# Patient Record
Sex: Female | Born: 1970 | Race: White | Marital: Married | Smoking: Current every day smoker
Health system: Southern US, Community
[De-identification: ages and names within clinical notes are randomized; demographics above are authoritative.]

## PROBLEM LIST (undated history)

## (undated) DIAGNOSIS — K219 Gastro-esophageal reflux disease without esophagitis: Secondary | ICD-10-CM

## (undated) DIAGNOSIS — Z9889 Other specified postprocedural states: Secondary | ICD-10-CM

## (undated) DIAGNOSIS — F32A Depression, unspecified: Secondary | ICD-10-CM

## (undated) HISTORY — PX: TUBAL LIGATION: SHX77

---

## 2005-05-17 ENCOUNTER — Ambulatory Visit: Payer: Self-pay | Admitting: Obstetrics and Gynecology

## 2009-12-08 ENCOUNTER — Ambulatory Visit: Payer: Self-pay | Admitting: Family Medicine

## 2011-12-06 ENCOUNTER — Ambulatory Visit: Payer: Self-pay | Admitting: Family Medicine

## 2011-12-14 ENCOUNTER — Ambulatory Visit: Payer: Self-pay | Admitting: Family Medicine

## 2012-09-18 ENCOUNTER — Ambulatory Visit: Payer: Self-pay | Admitting: Family Medicine

## 2012-10-01 ENCOUNTER — Ambulatory Visit: Payer: Self-pay | Admitting: Family Medicine

## 2013-09-29 ENCOUNTER — Ambulatory Visit: Payer: Self-pay

## 2013-09-29 DIAGNOSIS — R079 Chest pain, unspecified: Secondary | ICD-10-CM

## 2013-09-29 DIAGNOSIS — R52 Pain, unspecified: Secondary | ICD-10-CM

## 2013-09-29 LAB — CK TOTAL AND CKMB (NOT AT ARMC)
CK, Total: 72 U/L
CK-MB: 1.1 ng/mL (ref 0.5–3.6)

## 2013-09-29 LAB — COMPREHENSIVE METABOLIC PANEL
ALK PHOS: 78 U/L
AST: 11 U/L — AB (ref 15–37)
Albumin: 3.8 g/dL (ref 3.4–5.0)
Anion Gap: 9 (ref 7–16)
BUN: 7 mg/dL (ref 7–18)
Bilirubin,Total: 0.2 mg/dL (ref 0.2–1.0)
CREATININE: 0.65 mg/dL (ref 0.60–1.30)
Calcium, Total: 9.1 mg/dL (ref 8.5–10.1)
Chloride: 103 mmol/L (ref 98–107)
Co2: 28 mmol/L (ref 21–32)
EGFR (Non-African Amer.): >60
Glucose: 103 mg/dL — ABNORMAL HIGH (ref 65–99)
OSMOLALITY: 278 (ref 275–301)
Potassium: 3.3 mmol/L — ABNORMAL LOW (ref 3.5–5.1)
SGPT (ALT): 28 U/L (ref 12–78)
Sodium: 140 mmol/L (ref 136–145)
Total Protein: 7.2 g/dL (ref 6.4–8.2)

## 2013-09-29 LAB — CBC WITH DIFFERENTIAL/PLATELET
Basophil #: 0.1 10*3/uL (ref 0.0–0.1)
Basophil %: 0.4 %
EOS ABS: 0.1 10*3/uL (ref 0.0–0.7)
EOS PCT: 0.6 %
HCT: 41.9 % (ref 35.0–47.0)
HGB: 13.6 g/dL (ref 12.0–16.0)
Lymphocyte #: 1.3 10*3/uL (ref 1.0–3.6)
Lymphocyte %: 8.5 %
MCH: 30.3 pg (ref 26.0–34.0)
MCHC: 32.5 g/dL (ref 32.0–36.0)
MCV: 93 fL (ref 80–100)
MONOS PCT: 4.5 %
Monocyte #: 0.7 x10 3/mm (ref 0.2–0.9)
NEUTROS PCT: 86 %
Neutrophil #: 13 10*3/uL — ABNORMAL HIGH (ref 1.4–6.5)
Platelet: 361 10*3/uL (ref 150–440)
RBC: 4.49 10*6/uL (ref 3.80–5.20)
RDW: 13 % (ref 11.5–14.5)
WBC: 15.1 10*3/uL — ABNORMAL HIGH (ref 3.6–11.0)

## 2013-10-02 ENCOUNTER — Ambulatory Visit: Payer: Self-pay | Admitting: Family Medicine

## 2015-06-09 ENCOUNTER — Encounter: Payer: Self-pay | Admitting: Emergency Medicine

## 2015-06-09 ENCOUNTER — Ambulatory Visit (INDEPENDENT_AMBULATORY_CARE_PROVIDER_SITE_OTHER): Payer: BLUE CROSS/BLUE SHIELD

## 2015-06-09 ENCOUNTER — Ambulatory Visit
Admission: EM | Admit: 2015-06-09 | Discharge: 2015-06-09 | Disposition: A | Discharge status: Home / Self Care | Payer: BLUE CROSS/BLUE SHIELD | Attending: Family Medicine | Admitting: Family Medicine

## 2015-06-09 DIAGNOSIS — R109 Unspecified abdominal pain: Secondary | ICD-10-CM | POA: Insufficient documentation

## 2015-06-09 DIAGNOSIS — Z87440 Personal history of urinary (tract) infections: Secondary | ICD-10-CM | POA: Insufficient documentation

## 2015-06-09 LAB — URINALYSIS COMPLETE WITH MICROSCOPIC (ARMC ONLY)
Bilirubin Urine: NEGATIVE
GLUCOSE, UA: NEGATIVE mg/dL
Ketones, ur: NEGATIVE mg/dL
Leukocytes, UA: NEGATIVE
Nitrite: NEGATIVE
Protein, ur: NEGATIVE mg/dL
SPECIFIC GRAVITY, URINE: 1.01 (ref 1.005–1.030)
pH: 7 (ref 5.0–8.0)

## 2015-06-09 NOTE — ED Notes (Signed)
Patient c/o lower left sided back pain since Friday.  Patient denies N/V.  Patient denies fevers or chills.

## 2015-06-09 NOTE — ED Provider Notes (Signed)
Patient presents today with left flank discomfort since last week. Patient did get treated for UTI with Macrobid. Patient denies any dysuria, suprapubic tenderness, hematuria, fever, nausea, vomiting. Her symptoms have now started to radiate to the left lower anterior rib area. She feels a discomfort/itchiness in the area. She does not have any rash. She has been under quite a bit of stress over the last 2 weeks. She denies any shortness of breath, diaphoresis, headache. She does smoke and does admit to a history of GERD. She has noticed some coughing on occasion over the last 1-2 weeks.  ROS: Negative except mentioned above.  Vitals as per Epic GENERAL: NAD HEENT: no pharyngeal erythema, no exudate, no cervical LAD RESP: CTA B CARD: RRR ABD: +BS, NT, no guarding or rebound, mild left flank pain that radiates to left anterior rib cage, no rash in the area appreciated  NEURO: CN II-XII grossly intact   A/P: Left flank pain extending to left anterior rib cage- EKG was done which shows normal sinus rhythm with no ST elevation or depression or T-wave inversion, chest x-ray did not show any acute process, discussed with patient that if she develops any rash related to shingles given the distribution of her pain she should seek medical attention, the symptoms may be muscular in origin. Her urine does show 1+ blood but no white blood cells or bacteria. She does not have any urinary symptoms at this time. If her symptoms do persist or worsen I would do recommend further workup with imaging perhaps.  Jolene ProvostKirtida Kelsay Haggard, MD 06/09/15 1344

## 2015-06-09 NOTE — Discharge Instructions (Signed)
Seek medical attention if symptoms persist or worsen as discussed. If rash develops or other symptoms develop seek medical attention.

## 2015-06-11 LAB — URINE CULTURE
Culture: NO GROWTH
Special Requests: NORMAL

## 2015-06-11 NOTE — ED Notes (Signed)
Final report of urine C&S negative 

## 2018-05-28 ENCOUNTER — Encounter: Payer: Self-pay | Admitting: Adult Health

## 2019-01-06 ENCOUNTER — Other Ambulatory Visit: Payer: BLUE CROSS/BLUE SHIELD

## 2019-01-06 ENCOUNTER — Telehealth: Payer: Self-pay | Admitting: *Deleted

## 2019-01-06 DIAGNOSIS — Z20822 Contact with and (suspected) exposure to covid-19: Secondary | ICD-10-CM

## 2019-01-06 NOTE — Telephone Encounter (Signed)
Works for [employer]. Co-worker tested positive Covid19 3 days ago. Was told not to return to work until further notice. No fever nor any other symptoms. She and this person handled same items, frequently. She is required to have a negative test before returning to work. 14 days quarantine from exposure from the contact time. Wear mask around others if she leaves the home. Disinfect common areas and hand washing frequently. Monitor temperature. Call PCP if any sx arise.  Appointment made for [facility] this morning at 10:45a. Made aware to wear mask and stay in vehicle.

## 2019-01-07 LAB — NOVEL CORONAVIRUS, NAA: SARS-CoV-2, NAA: NOT DETECTED

## 2021-05-25 ENCOUNTER — Other Ambulatory Visit: Payer: Self-pay | Admitting: Family Medicine

## 2021-05-25 DIAGNOSIS — Z139 Encounter for screening, unspecified: Secondary | ICD-10-CM

## 2021-05-27 ENCOUNTER — Other Ambulatory Visit: Payer: Self-pay

## 2021-05-27 ENCOUNTER — Ambulatory Visit
Admission: RE | Admit: 2021-05-27 | Discharge: 2021-05-27 | Disposition: A | Discharge status: Home / Self Care | Payer: BC Managed Care – PPO | Source: Ambulatory Visit | Attending: Family Medicine | Admitting: Family Medicine

## 2021-05-27 DIAGNOSIS — Z139 Encounter for screening, unspecified: Secondary | ICD-10-CM

## 2021-06-07 ENCOUNTER — Other Ambulatory Visit: Payer: Self-pay | Admitting: Family Medicine

## 2021-06-07 DIAGNOSIS — R928 Other abnormal and inconclusive findings on diagnostic imaging of breast: Secondary | ICD-10-CM

## 2021-06-08 ENCOUNTER — Other Ambulatory Visit: Payer: Self-pay | Admitting: Family Medicine

## 2021-06-08 DIAGNOSIS — R921 Mammographic calcification found on diagnostic imaging of breast: Secondary | ICD-10-CM

## 2021-06-08 DIAGNOSIS — R928 Other abnormal and inconclusive findings on diagnostic imaging of breast: Secondary | ICD-10-CM

## 2021-06-17 ENCOUNTER — Ambulatory Visit
Admission: RE | Admit: 2021-06-17 | Discharge: 2021-06-17 | Disposition: A | Discharge status: Home / Self Care | Payer: BC Managed Care – PPO | Source: Ambulatory Visit | Attending: Family Medicine | Admitting: Family Medicine

## 2021-06-17 ENCOUNTER — Other Ambulatory Visit: Payer: Self-pay

## 2021-06-17 DIAGNOSIS — R928 Other abnormal and inconclusive findings on diagnostic imaging of breast: Secondary | ICD-10-CM | POA: Diagnosis present

## 2021-06-17 DIAGNOSIS — R921 Mammographic calcification found on diagnostic imaging of breast: Secondary | ICD-10-CM | POA: Diagnosis present

## 2021-06-20 ENCOUNTER — Other Ambulatory Visit: Payer: Self-pay | Admitting: Family Medicine

## 2021-06-20 DIAGNOSIS — R928 Other abnormal and inconclusive findings on diagnostic imaging of breast: Secondary | ICD-10-CM

## 2021-06-20 DIAGNOSIS — R921 Mammographic calcification found on diagnostic imaging of breast: Secondary | ICD-10-CM

## 2021-06-28 ENCOUNTER — Ambulatory Visit
Admission: RE | Admit: 2021-06-28 | Discharge: 2021-06-28 | Disposition: A | Discharge status: Home / Self Care | Payer: BC Managed Care – PPO | Source: Ambulatory Visit | Attending: Family Medicine | Admitting: Family Medicine

## 2021-06-28 ENCOUNTER — Other Ambulatory Visit: Payer: Self-pay

## 2021-06-28 DIAGNOSIS — R928 Other abnormal and inconclusive findings on diagnostic imaging of breast: Secondary | ICD-10-CM

## 2021-06-28 DIAGNOSIS — R921 Mammographic calcification found on diagnostic imaging of breast: Secondary | ICD-10-CM

## 2021-06-28 HISTORY — PX: BREAST BIOPSY: SHX20

## 2021-06-29 LAB — SURGICAL PATHOLOGY

## 2022-09-13 ENCOUNTER — Other Ambulatory Visit: Payer: Self-pay | Admitting: Family Medicine

## 2022-09-13 DIAGNOSIS — Z1231 Encounter for screening mammogram for malignant neoplasm of breast: Secondary | ICD-10-CM

## 2022-09-20 ENCOUNTER — Ambulatory Visit
Admission: RE | Admit: 2022-09-20 | Discharge: 2022-09-20 | Disposition: A | Discharge status: Home / Self Care | Payer: BC Managed Care – PPO | Source: Ambulatory Visit | Attending: Family Medicine | Admitting: Family Medicine

## 2022-09-20 DIAGNOSIS — Z1231 Encounter for screening mammogram for malignant neoplasm of breast: Secondary | ICD-10-CM | POA: Diagnosis not present

## 2022-12-19 ENCOUNTER — Encounter: Payer: Self-pay | Admitting: Podiatry

## 2022-12-19 ENCOUNTER — Ambulatory Visit: Payer: BC Managed Care – PPO | Admitting: Podiatry

## 2022-12-19 VITALS — BP 140/86 | HR 87

## 2022-12-19 DIAGNOSIS — B070 Plantar wart: Secondary | ICD-10-CM

## 2022-12-19 MED ORDER — TERBINAFINE HCL 250 MG PO TABS: 250.0000 mg | ORAL_TABLET | Freq: Every day | ORAL | 0 refills | Status: DC

## 2022-12-19 NOTE — Progress Notes (Signed)
Chief Complaint  Patient presents with   Ingrown Toenail    "I have ingrown toenails on my both my big toes and I have fungus." N - ingrown toenail L - hallux b/l D - over 10 years O - gradually worse C - throb, ache, tender, sore, fungus, discolored, thick A - bedsheets touching, shoes, pressure T - I cut them out a few weeks ago   Callouses    "I have calluses and Plantar warts." N - calluses/warts L - left D - calluses-31 yrs, warts - 6/8 mos O - gradually worse C - tender, sore A - shoes, pressure, walking, standing, hit a rock  T - shave them down    HPI: 52 y.o. female presenting today as a new patient for evaluation of symptomatic plantar wart that has been ongoing mostly to the left side of the body of the past 30+ years.  She gets warts on her hands leg and foot of the left side.  Onset after childbirth.  She has had the warts cut out in the past and frozen by dermatology.  Currently she shaves them down herself.  Patient also states that she has suffered from ingrown toenails to the medial and lateral border of the bilateral great toes over the last 10 years.  Every few weeks she tries to trim the nails out.  Presenting for further treatment and evaluation  No past medical history on file.  Past Surgical History:  Procedure Laterality Date   BREAST BIOPSY Right 06/28/2021   affir bx, coil marker, path pending   TUBAL LIGATION      Allergies  Allergen Reactions   Sulfa Antibiotics Anaphylaxis     Physical Exam: General: The patient is alert and oriented x3 in no acute distress.  Dermatology: Skin is warm, dry and supple bilateral lower extremities.  Hyperkeratotic skin lesions noted with a central nucleated core and pinpoint bleeding with debridement to the left foot.  There is also incurvated nail to the medial and lateral border of the bilateral great toes consistent with ingrowing toenail  Vascular: Palpable pedal pulses bilaterally. Capillary refill  within normal limits.  No appreciable edema.  No erythema.  Neurological: Grossly intact via light touch  Musculoskeletal Exam: No pedal deformities noted   Assessment/Plan of Care: 1.  Plantar verruca left foot; multiple 2.  Ingrowing toenails medial and lateral borders bilateral great toes  -Excisional debridement of the hyperkeratotic plantar wart lesions was performed today using a 312 scalpel.   -Salicylic acid was applied.  Recommend salicylic acid daily as tolerated over the next 4 weeks -Return to clinic in 4 weeks on a Friday afternoon.  At that time we will reassess the plantar warts and also proceed with partial nail matricectomy of the medial and lateral border of the bilateral great toes.  Patient works 5AM - 1PM Monday through Friday.  We will perform the nail matricectomy's on a Friday after work so she has the weekend to allow him to rest and heal.       Felecia Shelling, DPM [facility]  Dr. Felecia Shelling, DPM    [address], [place] [address]  Office [phone]  Fax [phone]

## 2023-01-19 ENCOUNTER — Ambulatory Visit: Payer: BC Managed Care – PPO | Admitting: Podiatry

## 2023-01-19 ENCOUNTER — Encounter: Payer: Self-pay | Admitting: Podiatry

## 2023-01-19 VITALS — BP 134/88 | HR 81

## 2023-01-19 DIAGNOSIS — L600 Ingrowing nail: Secondary | ICD-10-CM

## 2023-01-19 DIAGNOSIS — B070 Plantar wart: Secondary | ICD-10-CM

## 2023-01-19 NOTE — Progress Notes (Signed)
   Chief Complaint  Patient presents with   Plantar Warts    "It's very sore."   Nail Problem    "I think he's doing my ingrown toenails today, both big toes."    Subjective: Patient presents today for evaluation of pain to the medial and lateral border bilateral great toes. Patient is concerned for possible ingrown nail.  It is very sensitive to touch.    Patient also presents for follow-up evaluation of plantar verruca along the plantar aspect of the left foot.  She has been applying salicylic acid with improvement.  Patient presents today for further treatment and evaluation.  No past medical history on file.  Objective:  General: Well developed, nourished, in no acute distress, alert and oriented x3   Dermatology: Skin is warm, dry and supple bilateral.  Medial and lateral border bilateral great toes is tender with evidence of an ingrowing nail. Pain on palpation noted to the border of the nail fold. The remaining nails appear unremarkable at this time.  There continues to be Hyperkeratotic skin lesions noted with a central nucleated core and pinpoint bleeding with debridement to the left foot.   Vascular: DP and PT pulses palpable.  No clinical evidence of vascular compromise  Neruologic: Grossly intact via light touch bilateral.  Musculoskeletal: No pedal deformity noted  Assesement: #1 Paronychia with ingrowing nail medial and lateral border bilateral great toes #2 multiple plantar verruca left foot  Plan of Care:  1. Patient evaluated.  2. Discussed treatment alternatives and plan of care. Explained nail avulsion procedure and post procedure course to patient. 3. Patient opted for permanent partial nail avulsion of the ingrown portion of the nail.  4. Prior to procedure, local anesthesia infiltration utilized using 3 ml of a 50:50 mixture of 2% plain lidocaine and 0.5% plain marcaine in a normal hallux block fashion and a betadine prep performed.  5. Partial permanent  nail avulsion with chemical matrixectomy performed using 3x30sec applications of phenol followed by alcohol flush.  6. Light dressing applied.  Post care instructions provided 7.  Continue salicylic acid application as tolerated 8.  Return to clinic 3 weeks  Felecia Shelling, DPM [facility]  Dr. Felecia Shelling, DPM    [address] [place], [place] [address]                Office [phone]  Fax [phone]

## 2023-02-06 ENCOUNTER — Ambulatory Visit: Payer: BC Managed Care – PPO | Admitting: Podiatry

## 2023-02-06 DIAGNOSIS — L600 Ingrowing nail: Secondary | ICD-10-CM | POA: Diagnosis not present

## 2023-02-11 NOTE — Progress Notes (Signed)
   Chief Complaint  Patient presents with   Ingrown Toenail    Patient came in today for Bilateral  ingrown follow-up, and plantar wart follow-up     Subjective: 52 y.o. female presents today status post permanent nail avulsion procedure of the medial and lateral border bilateral great toes that was performed on 01/19/2023.  Patient doing well.  No new complaints.   No past medical history on file.  Objective: Neurovascular status intact.  Skin is warm, dry and supple. Nail and respective nail fold appears to be healing appropriately. There continues to be Hyperkeratotic skin lesions noted with a central nucleated core and pinpoint bleeding with debridement to the left foot.  Assessment: #1 s/p partial permanent nail matrixectomy medial and lateral border bilateral great toes #2 multiple plantar verruca left foot  Plan of care: -Patient was evaluated  -Light debridement of the periungual debris was performed to the border of the respective toe and nail plate using a tissue nipper. -Continue OTC topical salicylic acid as needed -Patient is to return to clinic on a PRN basis.   Felecia Shelling, DPM [facility]  Dr. Felecia Shelling, DPM    [address] [place], [place] [address]                Office [phone]  Fax [phone]

## 2023-05-01 IMAGING — MG MM BREAST BX W/ LOC DEV 1ST LESION IMAGE BX SPEC STEREO GUIDE*R*
6 of 9 series · 6 of 21 positions shown · non-contrast
Comparison: Previous exams.
COMPARISON: Previous exams.

Addendum:
CLINICAL DATA: Patient presents for stereotactic guided core biopsy
of RIGHT breast calcifications.

EXAM:
RIGHT BREAST STEREOTACTIC CORE NEEDLE BIOPSY

[R (1 of 3)]
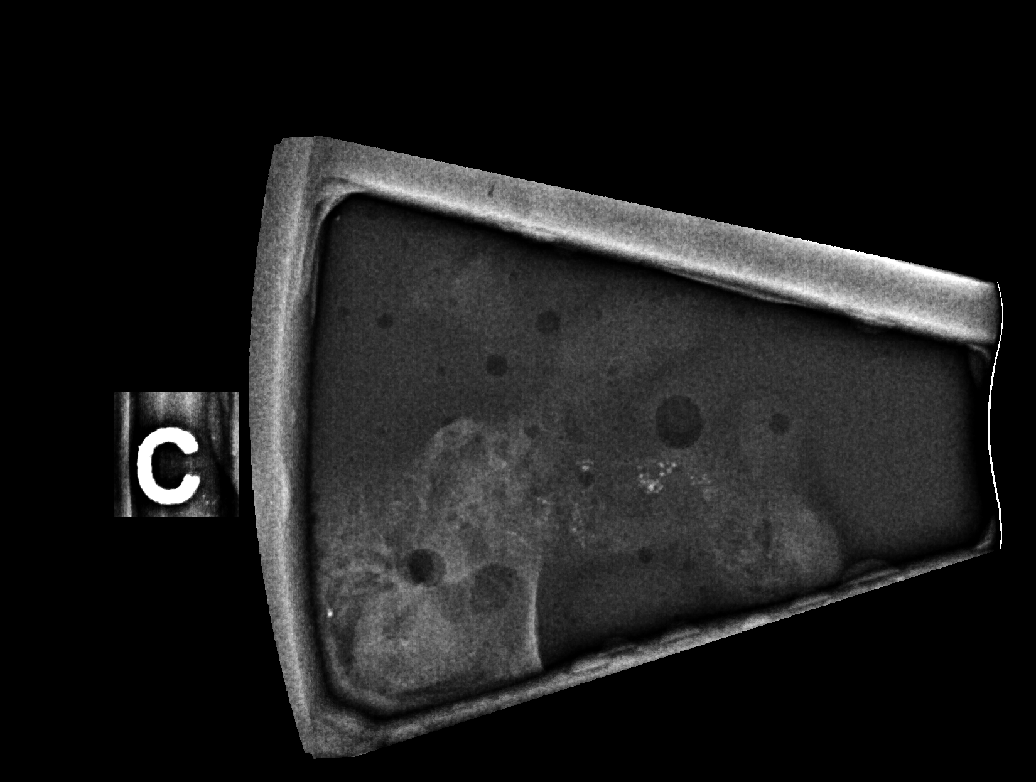

[R (2 of 3)]
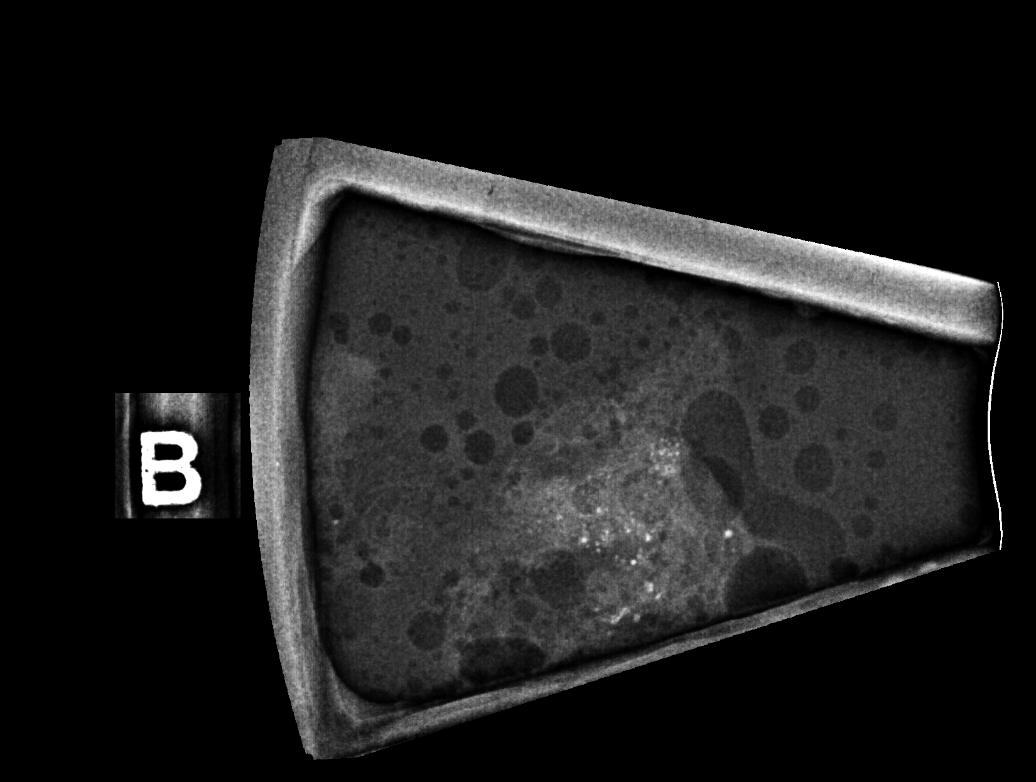

[R (3 of 3)]
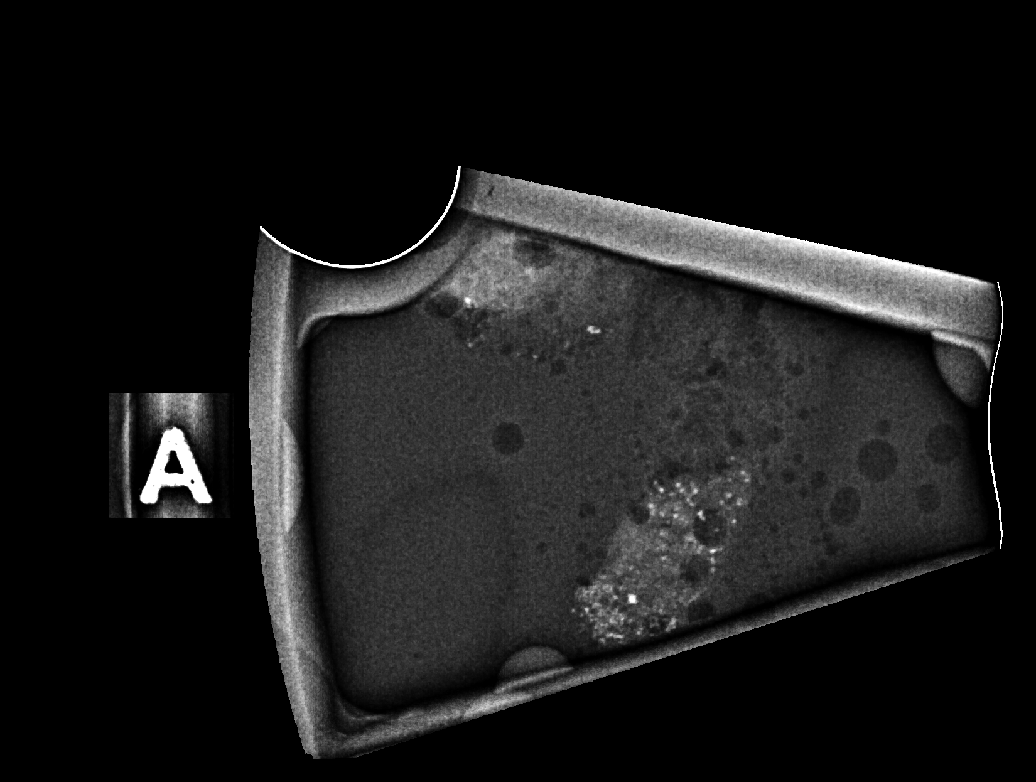

[R CC (1 of 3)]
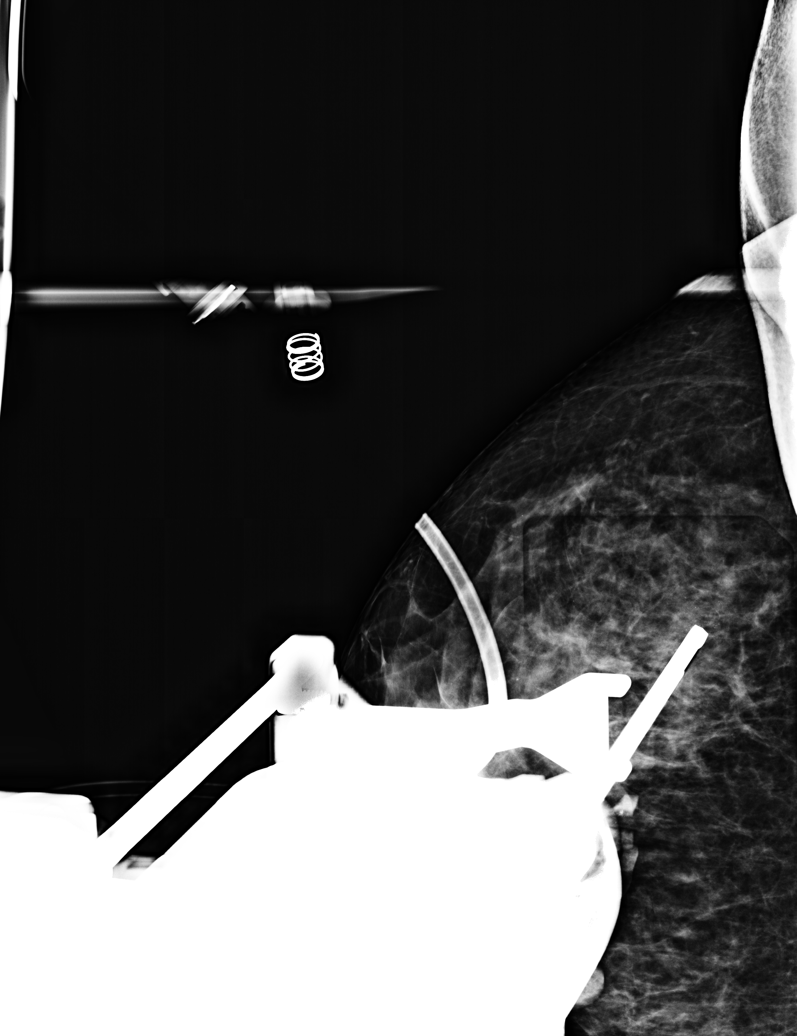

[R CC (2 of 3)]
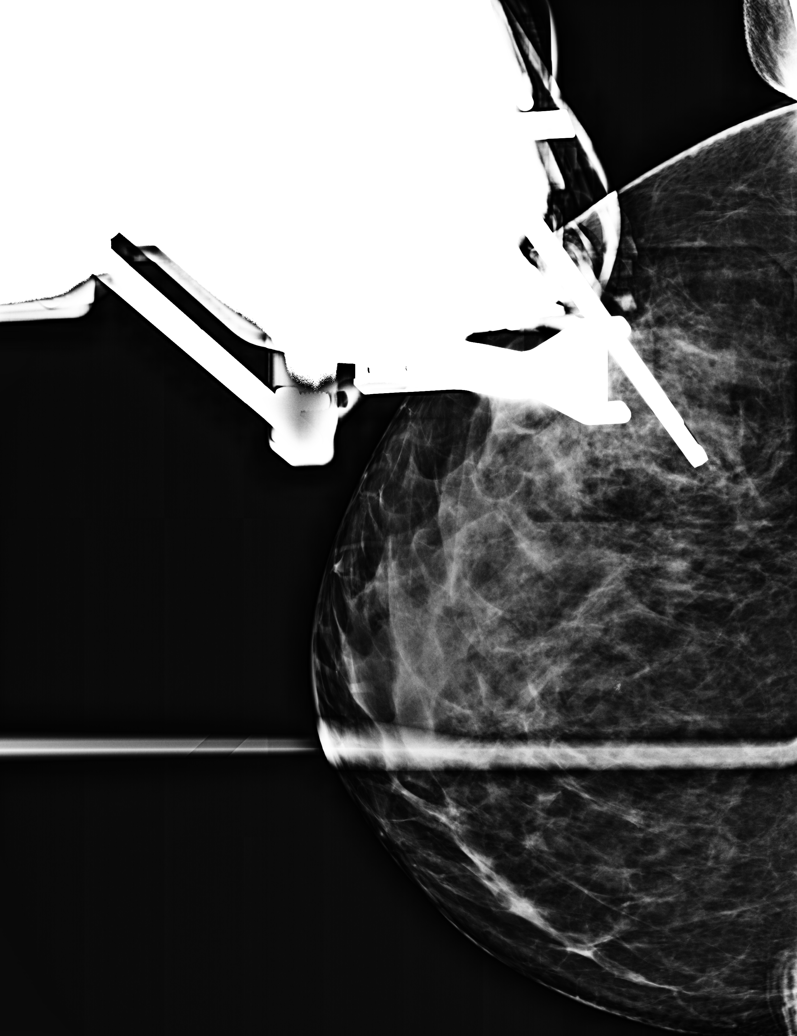

[R CC (3 of 3)]
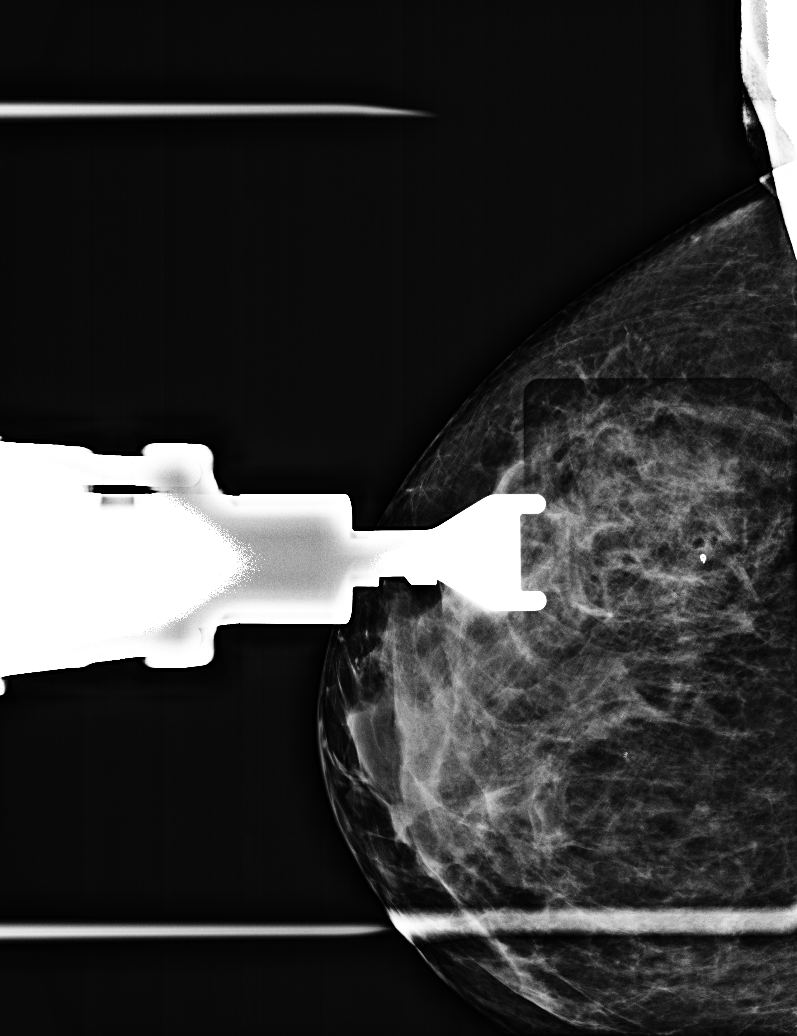

[6 of 21 positions shown; findings below may reference images not displayed]



Using sterile technique and 1% lidocaine and 1% lidocaine with
epinephrine as local anesthetic, under stereotactic guidance, a 9
gauge vacuum assisted device was used to perform core needle biopsy
of calcifications in the UPPER-OUTER QUADRANT of the RIGHT breast
using a craniocaudal approach. Specimen radiograph was performed
showing calcifications in numerous tissue samples. Specimens with
calcifications are identified for pathology.

Lesion quadrant: UPPER-OUTER QUADRANT RIGHT breast

At the conclusion of the procedure, coil shaped shaped tissue marker
clip was deployed into the biopsy cavity. Follow-up 2-view mammogram
was performed and dictated separately.
IMPRESSION: Stereotactic-guided biopsy of RIGHT breast calcifications. No
apparent complications.

ADDENDUM:
PATHOLOGY revealed: A. RIGHT BREAST, UPPER OUTER QUADRANT
CALCIFICATIONS; STEREOTACTIC BIOPSY: - FIBROCYSTIC AND COLUMNAR CELL
CHANGES WITH ASSOCIATED CALCIFICATIONS. - NEGATIVE FOR ATYPIA AND
MALIGNANCY. Comment: The report was amended to correct a
typographical error. The diagnosis remains unchanged.

Pathology results are CONCORDANT with imaging findings, per Dr.
Pweedi Obf.

Pathology results and recommendations were discussed with patient
via telephone on 06/29/2021. Patient reported biopsy site doing well
with no adverse symptoms, and only slight tenderness at the site.
Post biopsy care instructions were reviewed, questions were answered
and my direct phone number was provided. Patient was instructed to
call [HOSPITAL] for any additional questions or concerns
related to biopsy site.

RECOMMENDATION: Patient instructed to continue monthly self breast
examinations and resume annual bilateral screening mammogram due
April 2022.

Pathology results reported by Auntyjatty Delowr RN on 06/30/2021.



Using sterile technique and 1% lidocaine and 1% lidocaine with
epinephrine as local anesthetic, under stereotactic guidance, a 9
gauge vacuum assisted device was used to perform core needle biopsy
of calcifications in the UPPER-OUTER QUADRANT of the RIGHT breast
using a craniocaudal approach. Specimen radiograph was performed
showing calcifications in numerous tissue samples. Specimens with
calcifications are identified for pathology.

Lesion quadrant: UPPER-OUTER QUADRANT RIGHT breast

At the conclusion of the procedure, coil shaped shaped tissue marker
clip was deployed into the biopsy cavity. Follow-up 2-view mammogram
was performed and dictated separately.
IMPRESSION: Stereotactic-guided biopsy of RIGHT breast calcifications. No
apparent complications.

## 2023-05-08 ENCOUNTER — Ambulatory Visit: Payer: BC Managed Care – PPO | Admitting: Podiatry

## 2023-10-22 ENCOUNTER — Other Ambulatory Visit: Payer: Self-pay | Admitting: Family Medicine

## 2023-10-22 DIAGNOSIS — Z1231 Encounter for screening mammogram for malignant neoplasm of breast: Secondary | ICD-10-CM

## 2023-10-25 ENCOUNTER — Ambulatory Visit
Admission: RE | Admit: 2023-10-25 | Discharge: 2023-10-25 | Disposition: A | Discharge status: Home / Self Care | Source: Ambulatory Visit | Attending: Family Medicine | Admitting: Family Medicine

## 2023-10-25 DIAGNOSIS — Z1231 Encounter for screening mammogram for malignant neoplasm of breast: Secondary | ICD-10-CM | POA: Diagnosis present

## 2024-02-08 DIAGNOSIS — J189 Pneumonia, unspecified organism: Secondary | ICD-10-CM

## 2024-02-08 HISTORY — DX: Pneumonia, unspecified organism: J18.9

## 2024-02-13 ENCOUNTER — Other Ambulatory Visit: Payer: Self-pay | Admitting: Surgery

## 2024-02-15 ENCOUNTER — Ambulatory Visit (INDEPENDENT_AMBULATORY_CARE_PROVIDER_SITE_OTHER)

## 2024-02-15 ENCOUNTER — Ambulatory Visit: Admission: EM | Admit: 2024-02-15 | Discharge: 2024-02-15 | Disposition: A | Discharge status: Home / Self Care

## 2024-02-15 ENCOUNTER — Encounter: Payer: Self-pay | Admitting: Emergency Medicine

## 2024-02-15 DIAGNOSIS — S2232XA Fracture of one rib, left side, initial encounter for closed fracture: Secondary | ICD-10-CM

## 2024-02-15 DIAGNOSIS — R0781 Pleurodynia: Secondary | ICD-10-CM

## 2024-02-15 HISTORY — DX: Fracture of one rib, left side, initial encounter for closed fracture: S22.32XA

## 2024-02-15 MED ORDER — HYDROCODONE-ACETAMINOPHEN 5-325 MG PO TABS: 1.0000 | ORAL_TABLET | Freq: Four times a day (QID) | ORAL | 0 refills | Status: DC | PRN

## 2024-02-15 MED ORDER — KETOROLAC TROMETHAMINE 30 MG/ML IJ SOLN
30.0000 mg | Freq: Once | INTRAMUSCULAR | Status: AC
  Administered 2024-02-15: 30 mg via INTRAMUSCULAR

## 2024-02-15 NOTE — Discharge Instructions (Addendum)
 Your x-rays show that you did break your left 10th rib.  Unlike other fractures there is no cast that can be put in place to help stabilize the rib fracture.  However, you may purchase a rib belt from [employer] to help stabilize your ribs.  Wearing a shape wear a garment can also be effective in stabilizing the ribs.  I would recommend that you also Braisher rib with your arm or so whenever you cough or sneeze.  You do need to make sure that you are taking 10 deep breaths every hour to make sure that your lungs remain fully inflated and you do not develop a new pneumonia.  You may use over-the-counter Tylenol and/or ibuprofen according to the package instructions as needed for mild to moderate pain.  I have prescribed you Norco that you can use for more severe pain.  This medication contains hydrocodone which is also in your Hydromet.  Do not take both medications at the same time, you may take 1 or the other.  Also, the Norco contains Tylenol so make sure you are not taking more than 3000 mg of Tylenol in total in a 24-hour period.  If you develop any new or worsening symptoms other return for reevaluation or see your primary care provider.

## 2024-02-15 NOTE — ED Triage Notes (Signed)
 Pt c/o left sided rib pain, cough. She states she was diagnosed with bronchitis and pneumonia about 2 weeks ago. She has had the pain in her rib the entire time she was sick, but this morning she coughed hard this morning and heard a pop in this area. Pt is having trouble moving and taking a deep breath.

## 2024-02-15 NOTE — ED Provider Notes (Signed)
 [facility]    CSN: [id number] Arrival date & time: 02/15/24  0844      History   Chief Complaint Chief Complaint  Patient presents with   Cough   Pneumonia   left rib pain    HPI Julie Vang is a 53 y.o. female.   HPI  53 year old female with past medical here significant for GERD presents for evaluation of pain in her left lateral lower ribs after coughing hide this morning and hearing and feeling a pop.  She reports that the pain increases with movement but not with deep breathing.  She denies any shortness of breath.  She has not noticed any bruising, swelling, or redness.  History reviewed. No pertinent past medical history.  There are no active problems to display for this patient.   Past Surgical History:  Procedure Laterality Date   BREAST BIOPSY Right 06/28/2021   affir bx, coil marker, path pending   TUBAL LIGATION      OB History   No obstetric history on file.      Home Medications    Prior to Admission medications   Medication Sig Start Date End Date Taking? Authorizing Provider  amoxicillin-clavulanate (AUGMENTIN) 875-125 MG tablet SMARTSIG:1 Tablet(s) By Mouth Every 12 Hours 02/08/24  Yes [provider]  HYDROcodone-acetaminophen (NORCO/VICODIN) 5-325 MG tablet Take 1 tablet by mouth every 6 (six) hours as needed. 02/15/24  Yes Bernardino Ditch, NP  HYDROMET 5-1.5 MG/5ML syrup Take 5 mLs by mouth every 4 (four) hours as needed. 02/11/24  Yes [provider]  VENTOLIN HFA 108 (90 Base) MCG/ACT inhaler Inhale 2 puffs into the lungs every 4 (four) hours as needed. 02/04/24  Yes [provider]  esomeprazole (NEXIUM) 40 MG capsule Take 40 mg by mouth daily at 12 noon.    [provider]    Family History Family History  Problem Relation Age of Onset   Breast cancer Neg Hx     Social History Social History   Tobacco Use   Smoking status: Every Day    Current packs/day: 0.50    Types: Cigarettes    Smokeless tobacco: Never  Vaping Use   Vaping status: Never Used  Substance Use Topics   Alcohol use: No   Drug use: Never     Allergies   Sulfa antibiotics   Review of Systems Review of Systems  Respiratory:  Positive for cough. Negative for shortness of breath.   Cardiovascular:  Positive for chest pain.       Left lateral rib pain     Physical Exam Triage Vital Signs ED Triage Vitals [02/15/24 0855]  Encounter Vitals Group     BP      Girls Systolic BP Percentile      Girls Diastolic BP Percentile      Boys Systolic BP Percentile      Boys Diastolic BP Percentile      Pulse      Resp      Temp      Temp src      SpO2      Weight 145 lb 1 oz (65.8 kg)     Height 5' 4 (1.626 m)     Head Circumference      Peak Flow      Pain Score 9     Pain Loc      Pain Education      Exclude from Growth Chart    No data found.  Updated Vital Signs BP (!) 162/107 (BP Location: Right Arm)   Pulse 85   Temp 98 F (36.7 C) (Oral)   Resp 18   Ht 5' 4 (1.626 m)   Wt 145 lb 1 oz (65.8 kg)   SpO2 98%   BMI 24.90 kg/m   Visual Acuity Right Eye Distance:   Left Eye Distance:   Bilateral Distance:    Right Eye Near:   Left Eye Near:    Bilateral Near:     Physical Exam Vitals and nursing note reviewed.  Constitutional:      Appearance: Normal appearance. She is not ill-appearing.  HENT:     Head: Normocephalic and atraumatic.  Cardiovascular:     Rate and Rhythm: Normal rate and regular rhythm.     Pulses: Normal pulses.     Heart sounds: Normal heart sounds. No murmur heard.    No friction rub. No gallop.  Pulmonary:     Effort: Pulmonary effort is normal.     Breath sounds: Normal breath sounds. No wheezing, rhonchi or rales.     Comments: Patient is tender between the 9th and 10th rib in the midaxillary line on the left.  No appreciable crepitus.  No edema, ecchymosis, or erythema noted. Chest:     Chest wall: Tenderness present.  Skin:    General:  Skin is warm and dry.     Capillary Refill: Capillary refill takes less than 2 seconds.     Findings: No bruising or erythema.  Neurological:     General: No focal deficit present.     Mental Status: She is alert and oriented to person, place, and time.      UC Treatments / Results  Labs (all labs ordered are listed, but only abnormal results are displayed) Labs Reviewed - No data to display  EKG   Radiology DG Ribs Unilateral W/Chest Left Result Date: 02/15/2024 CLINICAL DATA:  53 year old female felt a pop when coughing. Pain over the 9th and 10th ribs. EXAM: LEFT RIBS AND CHEST - 3+ VIEW COMPARISON:  Chest radiographs 06/09/2015. FINDINGS: PA chest 0920 hours. Lung volumes and mediastinal contours remain normal. Visualized tracheal air column is within normal limits. No pneumothorax or pleural effusion. No consolidation. But patchy asymmetric peribronchial opacity in the right lower lung. Left lung appears negative. Negative visible bowel gas.  Normal background bone mineralization. Four views of the left ribs. Left lower ribs marked as the area of clinical concern, 10th rib level. No displaced left rib fracture. But suspicion of a nondisplaced left anterior 10th rib fracture on image #4 and #5. No other left rib fracture identified. Other visible osseous structures appear intact. Abdominal Calcified aortic atherosclerosis. IMPRESSION: 1. Nondisplaced left anterior 10th rib fracture. 2. Evidence of right lung bronchopneumonia. No consolidation or pleural effusion. 3.  Aortic Atherosclerosis (ICD10-I70.0). Electronically Signed   By: VEAR Hurst M.D.   On: 02/15/2024 09:41    Procedures Procedures (including critical care time)  Medications Ordered in UC Medications  ketorolac (TORADOL) 30 MG/ML injection 30 mg (30 mg Intramuscular Given 02/15/24 0933)    Initial Impression / Assessment and Plan / UC Course  I have reviewed the triage vital signs and the nursing notes.  Pertinent  labs & imaging results that were available during my care of the patient were reviewed by me and considered in my medical decision making (see chart for details).   Patient is a pleasant, nontoxic-appearing 53 year old female presenting for evaluation of left lateral  rib pain as outlined in HPI above.  In the exam room she is able to speak in full sentences no dyspnea or tachypnea.  Respiratory triage was 18 with a 98% room air oxygen saturation.  Her lungs are clear to auscultation in all fields.  She does have tenderness with palpation of the area around the 9th and 10th rib, mid axillary line on the left.  I do not appreciate crepitus and there is no edema, ecchymosis, or erythema.  I suspect that the patient has strained the muscle tissue between her ribs as a result of her coughing due to her bronchitis and pneumonia.  However, I will obtain a radiograph of the left ribs to rule out the presence of rib fracture or injury.  The patient has not take anything for pain this morning and I offered her oral or IM pain medication and she has selected IM.  I will order 30 mg of IM Toradol.  Patient's blood pressure is elevated at 162/107 and I suspect it is secondary to pain as patient has no documented history of hypertension.  I will have staff recheck a manual blood pressure 20 minutes after Toradol administration.  Left rib x-rays independently reviewed and evaluated by me.  Impression: Questionable incomplete fracture of the left ninth rib best visualized on the oblique view.  Radiology overread is pending. Radiology impression states nondisplaced left anterior 10th rib fracture.  I will discharge patient home with diagnosis of rib fracture and I will have her use over-the-counter Tylenol and/or ibuprofen as needed for mild to moderate pain.  I will give her a short prescription of Norco that she can use for more severe pain.  Review of PDMP shows that patient was recently prescribed Hydromet for her cough.   I will advised her not to take the medications together.   Final Clinical Impressions(s) / UC Diagnoses   Final diagnoses:  Rib pain on left side  Closed fracture of one rib of left side, initial encounter     Discharge Instructions      Your x-rays show that you did break your left 10th rib.  Unlike other fractures there is no cast that can be put in place to help stabilize the rib fracture.  However, you may purchase a rib belt from [employer] to help stabilize your ribs.  Wearing a shape wear a garment can also be effective in stabilizing the ribs.  I would recommend that you also Braisher rib with your arm or so whenever you cough or sneeze.  You do need to make sure that you are taking 10 deep breaths every hour to make sure that your lungs remain fully inflated and you do not develop a new pneumonia.  You may use over-the-counter Tylenol and/or ibuprofen according to the package instructions as needed for mild to moderate pain.  I have prescribed you Norco that you can use for more severe pain.  This medication contains hydrocodone which is also in your Hydromet.  Do not take both medications at the same time, you may take 1 or the other.  Also, the Norco contains Tylenol so make sure you are not taking more than 3000 mg of Tylenol in total in a 24-hour period.  If you develop any new or worsening symptoms other return for reevaluation or see your primary care provider.     ED Prescriptions     Medication Sig Dispense Auth. Provider   HYDROcodone-acetaminophen (NORCO/VICODIN) 5-325 MG tablet Take 1 tablet by mouth  every 6 (six) hours as needed. 10 tablet Bernardino Ditch, NP      I have reviewed the PDMP during this encounter.   Bernardino Ditch, NP 02/15/24 [phone]

## 2024-02-20 ENCOUNTER — Encounter
Admission: RE | Admit: 2024-02-20 | Discharge: 2024-02-20 | Disposition: A | Discharge status: Home / Self Care | Source: Ambulatory Visit | Attending: Surgery | Admitting: Surgery

## 2024-02-20 ENCOUNTER — Other Ambulatory Visit: Payer: Self-pay

## 2024-02-20 VITALS — Ht 64.0 in | Wt 164.0 lb

## 2024-02-20 DIAGNOSIS — Z01812 Encounter for preprocedural laboratory examination: Secondary | ICD-10-CM

## 2024-02-20 HISTORY — DX: Gastro-esophageal reflux disease without esophagitis: K21.9

## 2024-02-20 HISTORY — DX: Nausea with vomiting, unspecified: Z98.890

## 2024-02-20 HISTORY — DX: Depression, unspecified: F32.A

## 2024-02-20 NOTE — Pre-Procedure Instructions (Signed)
 Recent pneumonia and fractured rib from coughing. Stated that she currently has minimal coughing; hasn't used cough or pain medication for a week.

## 2024-02-20 NOTE — Patient Instructions (Addendum)
 Your procedure is scheduled on: Wednesday, July 30 Report to the Registration Desk on the 1st floor of the [facility] To find out your arrival time, please call [phone] between 1PM - 3PM on: Tuesday, July 29 If your arrival time is 6:00 am, do not arrive before that time as the [facility] entrance doors do not open until 6:00 am.  REMEMBER: Instructions that are not followed completely may result in serious medical risk, up to and including death; or upon the discretion of your surgeon and anesthesiologist your surgery may need to be rescheduled.  Do not eat food after midnight the night before surgery.  No gum chewing or hard candies.  You may however, drink CLEAR liquids up to 2 hours before you are scheduled to arrive for your surgery. Do not drink anything within 2 hours of your scheduled arrival time.  Clear liquids include: - water  - apple juice without pulp - gatorade (not RED colors) - black coffee or tea (Do NOT add milk or creamers to the coffee or tea) Do NOT drink anything that is not on this list.  In addition, your doctor has ordered for you to drink the provided:  Ensure Pre-Surgery Clear Carbohydrate Drink  Drinking this carbohydrate drink up to two hours before surgery helps to reduce insulin resistance and improve patient outcomes. Please complete drinking 2 hours before scheduled arrival time.  One week prior to surgery: starting today, July 23 Stop Anti-inflammatories (NSAIDS) such as Advil, Aleve, Ibuprofen, Motrin, Naproxen, Naprosyn and Aspirin based products such as Excedrin, Goody's Powder, BC Powder. Stop ANY OVER THE COUNTER supplements until after surgery.  You may however, continue to take Tylenol  if needed for pain up until the day of surgery.  Continue taking all of your other prescription medications up until the day of surgery.  ON THE DAY OF SURGERY ONLY TAKE THESE MEDICATIONS WITH SIPS OF WATER:  esomeprazole (NEXIUM)   No Alcohol  for 24 hours before or after surgery.  No Smoking including e-cigarettes for 24 hours before surgery.  No chewable tobacco products for at least 6 hours before surgery.  No nicotine patches on the day of surgery.  Do not use any recreational drugs for at least a week (preferably 2 weeks) before your surgery.  Please be advised that the combination of cocaine and anesthesia may have negative outcomes, up to and including death. If you test positive for cocaine, your surgery will be cancelled.  On the morning of surgery brush your teeth with toothpaste and water, you may rinse your mouth with mouthwash if you wish. Do not swallow any toothpaste or mouthwash.  Use CHG Soap as directed on instruction sheet.  Do not wear jewelry, make-up, hairpins, clips or nail polish.  For welded (permanent) jewelry: bracelets, anklets, waist bands, etc.  Please have this removed prior to surgery.  If it is not removed, there is a chance that hospital personnel will need to cut it off on the day of surgery.  Do not wear lotions, powders, or perfumes.   Do not shave body hair from the neck down 48 hours before surgery.  Contact lenses, hearing aids and dentures may not be worn into surgery.  Do not bring valuables to the hospital. [facility] is not responsible for any missing/lost belongings or valuables.   Notify your doctor if there is any change in your medical condition (cold, fever, infection).  Wear comfortable clothing (specific to your surgery type) to the hospital.  After surgery, you can help prevent lung complications by doing breathing exercises.  Take deep breaths and cough every 1-2 hours. Your doctor may order a device called an Incentive Spirometer to help you take deep breaths.  If you are being discharged the day of surgery, you will not be allowed to drive home. You will need a responsible individual to drive you home and stay with you for 24 hours after surgery.   If you are  taking public transportation, you will need to have a responsible individual with you.  Please call the Pre-admissions Testing Dept. at [phone] if you have any questions about these instructions.  Surgery Visitation Policy:  Patients having surgery or a procedure may have two visitors.  Children under the age of 44 must have an adult with them who is not the patient.  Merchandiser, retail to address health-related social needs:  [email or web address]      Preparing for Surgery with CHLORHEXIDINE GLUCONATE (CHG) Soap  Chlorhexidine Gluconate (CHG) Soap  o An antiseptic cleaner that kills germs and bonds with the skin to continue killing germs even after washing  o Used for showering the night before surgery and morning of surgery  Before surgery, you can play an important role by reducing the number of germs on your skin.  CHG (Chlorhexidine gluconate) soap is an antiseptic cleanser which kills germs and bonds with the skin to continue killing germs even after washing.  Please do not use if you have an allergy to CHG or antibacterial soaps. If your skin becomes reddened/irritated stop using the CHG.  1. Shower the NIGHT BEFORE SURGERY and the MORNING OF SURGERY with CHG soap.  2. If you choose to wash your hair, wash your hair first as usual with your normal shampoo.  3. After shampooing, rinse your hair and body thoroughly to remove the shampoo.  4. Use CHG as you would any other liquid soap. You can apply CHG directly to the skin and wash gently with a scrungie or a clean washcloth.  5. Apply the CHG soap to your body only from the neck down. Do not use on open wounds or open sores. Avoid contact with your eyes, ears, mouth, and genitals (private parts). Wash face and genitals (private parts) with your normal soap.  6. Wash thoroughly, paying special attention to the area where your surgery will be performed.  7. Thoroughly rinse your body with warm  water.  8. Do not shower/wash with your normal soap after using and rinsing off the CHG soap.  9. Pat yourself dry with a clean towel.  10. Wear clean pajamas to bed the night before surgery.  12. Place clean sheets on your bed the night of your first shower and do not sleep with pets.  13. Shower again with the CHG soap on the day of surgery prior to arriving at the hospital.  14. Do not apply any deodorants/lotions/powders.  15. Please wear clean clothes to the hospital.

## 2024-02-26 MED ORDER — CHLORHEXIDINE GLUCONATE 0.12 % MT SOLN
15.0000 mL | Freq: Once | OROMUCOSAL | Status: AC
  Administered 2024-02-27: 15 mL via OROMUCOSAL

## 2024-02-26 MED ORDER — LACTATED RINGERS IV SOLN: INTRAVENOUS | Status: DC

## 2024-02-26 MED ORDER — ORAL CARE MOUTH RINSE: 15.0000 mL | Freq: Once | OROMUCOSAL | Status: AC

## 2024-02-26 MED ORDER — CEFAZOLIN SODIUM-DEXTROSE 2-4 GM/100ML-% IV SOLN
2.0000 g | INTRAVENOUS | Status: AC
  Administered 2024-02-27: 2 g via INTRAVENOUS

## 2024-02-26 NOTE — H&P (Incomplete Revision)
 History of Present Illness:  Julie Vang is a 53 y.o. female who presents for evaluation and treatment of her bilateral basilar thumb pain, right equal to left. The patient notes that the symptoms have been present for several years and developed without any specific cause or injury. 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 Her symptoms are worse with any grasping or holding of objects. She has difficulty grasping or turning a doorknob. She denies any numbness or paresthesias to either hand. She has tried over-the-counter medications, Mobic, and activity modification in the past with limited benefit.  Current Outpatient Medications:  esomeprazole (NEXIUM) 40 MG DR capsule Take by mouth  meclizine (ANTIVERT) 25 mg tablet TAKE 1 TABLET BY MOUTH FOUR TIMES DAILY AS NEEDED FOR DIZZINESS  meloxicam (MOBIC) 7.5 MG tablet Take 7.5 mg by mouth once daily  sertraline (ZOLOFT) 50 MG tablet Take 50 mg by mouth once daily  nitrofurantoin (MACRODANTIN) 100 MG capsule Take 100 mg by mouth 2 (two) times daily (Patient not taking: Reported on 01/09/2022)   Allergies:  Sulfa (Sulfonamide Antibiotics) Rash   Past Medical History:  Acid reflux  History of abnormal cervical Pap smear   Past Surgical History:   TUBAL LIGATION 2005   Family History:  Diabetes Mother  Arthritis Mother  Systemic lupus erythematosis Mother  Heart disease Father  Heart disease Maternal Grandfather  Heart disease Paternal Grandfather   Social History:   Socioeconomic History:  Marital status: Married  Tobacco Use  Smoking status: Every Day  Types: Cigarettes  Smokeless tobacco: Never  Substance and Sexual Activity  Alcohol use: Not Currently  Drug use: Never  Sexual activity: Yes  Partners: Male  Birth control/protection: None   Review of Systems:  A comprehensive 14 point ROS  was performed, reviewed, and the pertinent orthopaedic findings are documented in the HPI.  Physical Exam: Vitals:  01/14/24 1321  BP: (!) 140/82  Weight: 77.1 kg (170 lb)  Height: 162.6 cm (5' 4)  PainSc: 7  PainLoc: Finger   General/Constitutional: The patient appears to be well-nourished, well-developed, and in no acute distress. Neuro/Psych: Normal mood and affect, oriented to person, place and time. Eyes: Non-icteric. Pupils are equal, round, and reactive to light, and exhibit synchronous movement. ENT: Unremarkable. Lymphatic: No palpable adenopathy. Respiratory: Lungs clear to auscultation, Normal chest excursion, No wheezes, and Non-labored breathing Cardiovascular: Regular rate and rhythm. No murmurs. and No edema, swelling or tenderness, except as noted in detailed exam. Integumentary: No impressive skin lesions present, except as noted in detailed exam. Musculoskeletal: Unremarkable, except as noted in detailed exam.  Right wrist/hand exam: Skin inspection of the right wrist and hand is notable for swelling and deformity/prominence of the base of the right thumb, but otherwise is unremarkable. No erythema, ecchymosis, abrasions, or other skin abnormalities are identified. She has moderate tenderness to palpation over the base of her right thumb. There is moderate crepitance and pain with shucking of the thumb CMC joint. She is able to actively flex and extend her wrist without any pain or catching. She can flex and extend all digits fully without any pain or triggering. She is grossly neurovascularly intact to all digits.  X-rays/MRI/Lab data:  AP, lateral, and oblique views of the right thumb are obtained. These films demonstrate moderate-severe degenerative changes of the right thumb CMC joint as manifest by joint  subluxation, joint space narrowing, sclerosis, and subchondral cystic changes.  Assessment: 1. 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  Plan: The  treatment options were discussed with the patient and her aunt. In addition, patient educational materials were provided regarding the diagnosis and treatment options. The patient is quite frustrated by her symptoms and functional limitations, and is ready to consider more aggressive treatment options. Therefore, I have recommended a surgical procedure, specifically a suspension arthroplasty of the right thumb CMC joint, as this is her dominant hand. The procedure was discussed with the patient, as were the potential risks (including bleeding, infection, nerve and/or blood vessel injury, persistent or recurrent pain, failure of the reconstructive procedure, stiffness of the thumb, weakness of grip, need for further surgery, blood clots, strokes, heart attacks and/or arhythmias, pneumonia, etc.) and benefits. The patient states her understanding and wishes to proceed. All of the patient's questions and concerns were answered. She can call any time with further concerns. She will follow up post-surgery, routine.    H&P reviewed and patient re-examined. No changes.

## 2024-02-26 NOTE — H&P (Signed)
 History of Present Illness:  Julie Vang is a 53 y.o. female who presents for evaluation and treatment of her bilateral basilar thumb pain, right equal to left. The patient notes that the symptoms have been present for several years and developed without any specific cause or injury. 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 Her symptoms are worse with any grasping or holding of objects. She has difficulty grasping or turning a doorknob. She denies any numbness or paresthesias to either hand. She has tried over-the-counter medications, Mobic, and activity modification in the past with limited benefit.  Current Outpatient Medications:  esomeprazole (NEXIUM) 40 MG DR capsule Take by mouth  meclizine (ANTIVERT) 25 mg tablet TAKE 1 TABLET BY MOUTH FOUR TIMES DAILY AS NEEDED FOR DIZZINESS  meloxicam (MOBIC) 7.5 MG tablet Take 7.5 mg by mouth once daily  sertraline (ZOLOFT) 50 MG tablet Take 50 mg by mouth once daily  nitrofurantoin (MACRODANTIN) 100 MG capsule Take 100 mg by mouth 2 (two) times daily (Patient not taking: Reported on 01/09/2022)   Allergies:  Sulfa (Sulfonamide Antibiotics) Rash   Past Medical History:  Acid reflux  History of abnormal cervical Pap smear   Past Surgical History:   TUBAL LIGATION 2005   Family History:  Diabetes Mother  Arthritis Mother  Systemic lupus erythematosis Mother  Heart disease Father  Heart disease Maternal Grandfather  Heart disease Paternal Grandfather   Social History:   Socioeconomic History:  Marital status: Married  Tobacco Use  Smoking status: Every Day  Types: Cigarettes  Smokeless tobacco: Never  Substance and Sexual Activity  Alcohol use: Not Currently  Drug use: Never  Sexual activity: Yes  Partners: Male  Birth control/protection: None   Review of Systems:  A comprehensive 14 point ROS  was performed, reviewed, and the pertinent orthopaedic findings are documented in the HPI.  Physical Exam: Vitals:  01/14/24 1321  BP: (!) 140/82  Weight: 77.1 kg (170 lb)  Height: 162.6 cm (5' 4)  PainSc: 7  PainLoc: Finger   General/Constitutional: The patient appears to be well-nourished, well-developed, and in no acute distress. Neuro/Psych: Normal mood and affect, oriented to person, place and time. Eyes: Non-icteric. Pupils are equal, round, and reactive to light, and exhibit synchronous movement. ENT: Unremarkable. Lymphatic: No palpable adenopathy. Respiratory: Lungs clear to auscultation, Normal chest excursion, No wheezes, and Non-labored breathing Cardiovascular: Regular rate and rhythm. No murmurs. and No edema, swelling or tenderness, except as noted in detailed exam. Integumentary: No impressive skin lesions present, except as noted in detailed exam. Musculoskeletal: Unremarkable, except as noted in detailed exam.  Right wrist/hand exam: Skin inspection of the right wrist and hand is notable for swelling and deformity/prominence of the base of the right thumb, but otherwise is unremarkable. No erythema, ecchymosis, abrasions, or other skin abnormalities are identified. She has moderate tenderness to palpation over the base of her right thumb. There is moderate crepitance and pain with shucking of the thumb CMC joint. She is able to actively flex and extend her wrist without any pain or catching. She can flex and extend all digits fully without any pain or triggering. She is grossly neurovascularly intact to all digits.  X-rays/MRI/Lab data:  AP, lateral, and oblique views of the right thumb are obtained. These films demonstrate moderate-severe degenerative changes of the right thumb CMC joint as manifest by joint  subluxation, joint space narrowing, sclerosis, and subchondral cystic changes.  Assessment: 1. 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  Plan: The  treatment options were discussed with the patient and her aunt. In addition, patient educational materials were provided regarding the diagnosis and treatment options. The patient is quite frustrated by her symptoms and functional limitations, and is ready to consider more aggressive treatment options. Therefore, I have recommended a surgical procedure, specifically a suspension arthroplasty of the right thumb CMC joint, as this is her dominant hand. The procedure was discussed with the patient, as were the potential risks (including bleeding, infection, nerve and/or blood vessel injury, persistent or recurrent pain, failure of the reconstructive procedure, stiffness of the thumb, weakness of grip, need for further surgery, blood clots, strokes, heart attacks and/or arhythmias, pneumonia, etc.) and benefits. The patient states her understanding and wishes to proceed. All of the patient's questions and concerns were answered. She can call any time with further concerns. She will follow up post-surgery, routine.    H&P reviewed and patient re-examined. No changes.

## 2024-02-27 ENCOUNTER — Ambulatory Visit

## 2024-02-27 ENCOUNTER — Other Ambulatory Visit: Payer: Self-pay

## 2024-02-27 ENCOUNTER — Encounter
Admission: RE | Disposition: A | Discharge status: Home / Self Care | Payer: Self-pay | Source: Home / Self Care | Attending: Surgery

## 2024-02-27 ENCOUNTER — Ambulatory Visit
Admission: RE | Admit: 2024-02-27 | Discharge: 2024-02-27 | Disposition: A | Discharge status: Home / Self Care | Attending: Surgery | Admitting: Surgery

## 2024-02-27 ENCOUNTER — Ambulatory Visit: Admitting: Anesthesiology

## 2024-02-27 ENCOUNTER — Encounter: Payer: Self-pay | Admitting: Surgery

## 2024-02-27 DIAGNOSIS — M189 Osteoarthritis of first carpometacarpal joint, unspecified: Secondary | ICD-10-CM | POA: Diagnosis present

## 2024-02-27 DIAGNOSIS — F32A Depression, unspecified: Secondary | ICD-10-CM | POA: Diagnosis not present

## 2024-02-27 DIAGNOSIS — K219 Gastro-esophageal reflux disease without esophagitis: Secondary | ICD-10-CM | POA: Insufficient documentation

## 2024-02-27 DIAGNOSIS — Z01812 Encounter for preprocedural laboratory examination: Secondary | ICD-10-CM

## 2024-02-27 DIAGNOSIS — F17210 Nicotine dependence, cigarettes, uncomplicated: Secondary | ICD-10-CM | POA: Diagnosis not present

## 2024-02-27 HISTORY — PX: CARPOMETACARPAL (CMC) FUSION OF THUMB: SHX6290

## 2024-02-27 SURGERY — CARPOMETACARPAL (CMC) FUSION OF THUMB
Anesthesia: General | Site: Thumb | Laterality: Right

## 2024-02-27 MED ORDER — OXYCODONE HCL 5 MG PO TABS
ORAL_TABLET | ORAL | Status: AC
Start: 2024-02-27 — End: 2024-02-27
  Filled 2024-02-27: qty 1

## 2024-02-27 MED ORDER — BUPIVACAINE HCL (PF) 0.5 % IJ SOLN
INTRAMUSCULAR | Status: DC | PRN
Start: 2024-02-27 — End: 2024-02-27
  Administered 2024-02-27: 10 mL

## 2024-02-27 MED ORDER — SODIUM CHLORIDE 0.9 % IV SOLN: INTRAVENOUS | Status: DC

## 2024-02-27 MED ORDER — MIDAZOLAM HCL 2 MG/2ML IJ SOLN
INTRAMUSCULAR | Status: DC | PRN
Start: 2024-02-27 — End: 2024-02-27
  Administered 2024-02-27: 2 mg via INTRAVENOUS

## 2024-02-27 MED ORDER — ONDANSETRON 4 MG PO TBDP
4.0000 mg | ORAL_TABLET | Freq: Three times a day (TID) | ORAL | 1 refills | Status: AC | PRN
Start: 2024-02-27 — End: ?

## 2024-02-27 MED ORDER — DROPERIDOL 2.5 MG/ML IJ SOLN
INTRAMUSCULAR | Status: AC
  Filled 2024-02-27: qty 2

## 2024-02-27 MED ORDER — SCOPOLAMINE 1 MG/3DAYS TD PT72
MEDICATED_PATCH | TRANSDERMAL | Status: AC
  Filled 2024-02-27: qty 1

## 2024-02-27 MED ORDER — ALBUTEROL SULFATE HFA 108 (90 BASE) MCG/ACT IN AERS
INHALATION_SPRAY | RESPIRATORY_TRACT | Status: DC | PRN
  Administered 2024-02-27: 4 via RESPIRATORY_TRACT

## 2024-02-27 MED ORDER — OXYCODONE HCL 5 MG PO TABS
5.0000 mg | ORAL_TABLET | Freq: Once | ORAL | Status: AC | PRN
  Administered 2024-02-27: 5 mg via ORAL

## 2024-02-27 MED ORDER — OXYCODONE HCL 5 MG/5ML PO SOLN: 5.0000 mg | Freq: Once | ORAL | Status: AC | PRN

## 2024-02-27 MED ORDER — ONDANSETRON HCL 4 MG/2ML IJ SOLN
4.0000 mg | Freq: Once | INTRAMUSCULAR | Status: AC | PRN
  Administered 2024-02-27: 4 mg via INTRAVENOUS

## 2024-02-27 MED ORDER — ACETAMINOPHEN 10 MG/ML IV SOLN
INTRAVENOUS | Status: DC | PRN
  Administered 2024-02-27: 1000 mg via INTRAVENOUS

## 2024-02-27 MED ORDER — 0.9 % SODIUM CHLORIDE (POUR BTL) OPTIME
TOPICAL | Status: DC | PRN
  Administered 2024-02-27: 150 mL

## 2024-02-27 MED ORDER — FENTANYL CITRATE (PF) 100 MCG/2ML IJ SOLN
25.0000 ug | INTRAMUSCULAR | Status: DC | PRN
  Administered 2024-02-27: 25 ug via INTRAVENOUS
  Administered 2024-02-27: 50 ug via INTRAVENOUS
  Administered 2024-02-27: 25 ug via INTRAVENOUS

## 2024-02-27 MED ORDER — DEXAMETHASONE SODIUM PHOSPHATE 10 MG/ML IJ SOLN
INTRAMUSCULAR | Status: DC | PRN
  Administered 2024-02-27: 5 mg via INTRAVENOUS

## 2024-02-27 MED ORDER — HYDROCODONE-ACETAMINOPHEN 5-325 MG PO TABS: 1.0000 | ORAL_TABLET | Freq: Four times a day (QID) | ORAL | 0 refills | Status: AC | PRN

## 2024-02-27 MED ORDER — ONDANSETRON HCL 4 MG/2ML IJ SOLN
INTRAMUSCULAR | Status: AC
  Filled 2024-02-27: qty 2

## 2024-02-27 MED ORDER — FENTANYL CITRATE (PF) 100 MCG/2ML IJ SOLN
INTRAMUSCULAR | Status: AC
  Filled 2024-02-27: qty 2

## 2024-02-27 MED ORDER — KETOROLAC TROMETHAMINE 30 MG/ML IJ SOLN: 30.0000 mg | Freq: Once | INTRAMUSCULAR | Status: DC

## 2024-02-27 MED ORDER — CEFAZOLIN SODIUM-DEXTROSE 2-4 GM/100ML-% IV SOLN
INTRAVENOUS | Status: AC
  Filled 2024-02-27: qty 100

## 2024-02-27 MED ORDER — SCOPOLAMINE 1 MG/3DAYS TD PT72
1.0000 | MEDICATED_PATCH | TRANSDERMAL | Status: DC
  Administered 2024-02-27: 1.5 mg via TRANSDERMAL

## 2024-02-27 MED ORDER — LACTATED RINGERS IV SOLN: INTRAVENOUS | Status: DC

## 2024-02-27 MED ORDER — KETOROLAC TROMETHAMINE 30 MG/ML IJ SOLN
INTRAMUSCULAR | Status: DC | PRN
  Administered 2024-02-27: 30 mg via INTRAVENOUS

## 2024-02-27 MED ORDER — CHLORHEXIDINE GLUCONATE 0.12 % MT SOLN
OROMUCOSAL | Status: AC
  Filled 2024-02-27: qty 15

## 2024-02-27 MED ORDER — HYDROCODONE-ACETAMINOPHEN 5-325 MG PO TABS: 1.0000 | ORAL_TABLET | ORAL | Status: DC | PRN

## 2024-02-27 MED ORDER — FENTANYL CITRATE (PF) 100 MCG/2ML IJ SOLN
INTRAMUSCULAR | Status: DC | PRN
  Administered 2024-02-27 (×2): 50 ug via INTRAVENOUS

## 2024-02-27 MED ORDER — ACETAMINOPHEN 10 MG/ML IV SOLN
INTRAVENOUS | Status: AC
  Filled 2024-02-27: qty 100

## 2024-02-27 MED ORDER — MIDAZOLAM HCL 2 MG/2ML IJ SOLN
INTRAMUSCULAR | Status: AC
  Filled 2024-02-27: qty 2

## 2024-02-27 MED ORDER — PROPOFOL 10 MG/ML IV BOLUS
INTRAVENOUS | Status: DC | PRN
Start: 2024-02-27 — End: 2024-02-27
  Administered 2024-02-27: 100 mg via INTRAVENOUS

## 2024-02-27 MED ORDER — ONDANSETRON HCL 4 MG/2ML IJ SOLN: 4.0000 mg | Freq: Four times a day (QID) | INTRAMUSCULAR | Status: DC | PRN

## 2024-02-27 MED ORDER — ONDANSETRON HCL 4 MG/2ML IJ SOLN
INTRAMUSCULAR | Status: DC | PRN
  Administered 2024-02-27: 4 mg via INTRAVENOUS

## 2024-02-27 MED ORDER — LIDOCAINE HCL (CARDIAC) PF 100 MG/5ML IV SOSY
PREFILLED_SYRINGE | INTRAVENOUS | Status: DC | PRN
  Administered 2024-02-27: 5 mg via INTRAVENOUS

## 2024-02-27 MED ORDER — ACETAMINOPHEN 325 MG PO TABS: 325.0000 mg | ORAL_TABLET | Freq: Four times a day (QID) | ORAL | Status: DC | PRN

## 2024-02-27 MED ORDER — ONDANSETRON HCL 4 MG PO TABS: 4.0000 mg | ORAL_TABLET | Freq: Four times a day (QID) | ORAL | Status: DC | PRN

## 2024-02-27 MED ORDER — DROPERIDOL 2.5 MG/ML IJ SOLN
0.6250 mg | Freq: Once | INTRAMUSCULAR | Status: AC
  Administered 2024-02-27: 0.625 mg via INTRAVENOUS

## 2024-02-27 MED ORDER — METOCLOPRAMIDE HCL 5 MG/ML IJ SOLN: 5.0000 mg | Freq: Three times a day (TID) | INTRAMUSCULAR | Status: DC | PRN

## 2024-02-27 MED ORDER — ACETAMINOPHEN 10 MG/ML IV SOLN: 1000.0000 mg | Freq: Once | INTRAVENOUS | Status: DC | PRN

## 2024-02-27 MED ORDER — METOCLOPRAMIDE HCL 10 MG PO TABS: 5.0000 mg | ORAL_TABLET | Freq: Three times a day (TID) | ORAL | Status: DC | PRN

## 2024-02-27 SURGICAL SUPPLY — 36 items
BLADE DEBAKEY 8.0 (BLADE) ×1 IMPLANT
BLADE OSC/SAGITTAL 5.5X25 (BLADE) ×1 IMPLANT
BLADE OSC/SAGITTAL MD 9X18.5 (BLADE) IMPLANT
BLADE SURG 15 STRL LF DISP TIS (BLADE) ×2 IMPLANT
BNDG COHESIVE 4X5 TAN STRL LF (GAUZE/BANDAGES/DRESSINGS) ×1 IMPLANT
BNDG ELASTIC 3INX 5YD STR LF (GAUZE/BANDAGES/DRESSINGS) ×1 IMPLANT
BNDG ESMARCH 4X12 STRL LF (GAUZE/BANDAGES/DRESSINGS) ×1 IMPLANT
BNDG STRETCH GAUZE 3IN X12FT (GAUZE/BANDAGES/DRESSINGS) ×1 IMPLANT
BUR 4X55 1 (BURR) ×1 IMPLANT
CHLORAPREP W/TINT 26 (MISCELLANEOUS) ×1 IMPLANT
CUFF TOURN SGL QUICK 18X4 (TOURNIQUET CUFF) ×1 IMPLANT
DRAPE FLUOR MINI C-ARM 54X84 (DRAPES) ×1 IMPLANT
FORCEPS JEWEL BIP 4-3/4 STR (INSTRUMENTS) ×1 IMPLANT
GAUZE XEROFORM 1X8 LF (GAUZE/BANDAGES/DRESSINGS) ×1 IMPLANT
GLOVE BIO SURGEON STRL SZ8 (GLOVE) ×2 IMPLANT
GLOVE INDICATOR 8.0 STRL GRN (GLOVE) ×1 IMPLANT
GOWN STRL REUS W/ TWL XL LVL3 (GOWN DISPOSABLE) ×1 IMPLANT
KIT TURNOVER KIT A (KITS) ×1 IMPLANT
MANIFOLD NEPTUNE II (INSTRUMENTS) ×1 IMPLANT
NS IRRIG 500ML POUR BTL (IV SOLUTION) ×1 IMPLANT
PACK EXTREMITY ARMC (MISCELLANEOUS) ×1 IMPLANT
PAD CAST 3X4 CTTN HI CHSV (CAST SUPPLIES) ×2 IMPLANT
PASSER SUT SWANSON 36MM LOOP (INSTRUMENTS) IMPLANT
PENCIL SMOKE EVACUATOR (MISCELLANEOUS) ×1 IMPLANT
SPLINT CAST 1 STEP 3X12 (MISCELLANEOUS) ×1 IMPLANT
STOCKINETTE 48X4 2 PLY STRL (GAUZE/BANDAGES/DRESSINGS) ×1 IMPLANT
STOCKINETTE IMPERVIOUS 9X36 MD (GAUZE/BANDAGES/DRESSINGS) ×1 IMPLANT
STOCKINETTE STRL 4IN 9604848 (GAUZE/BANDAGES/DRESSINGS) ×1 IMPLANT
STRIP CLOSURE SKIN 1/2X4 (GAUZE/BANDAGES/DRESSINGS) ×1 IMPLANT
STRIP CLOSURE SKIN 1/4X4 (GAUZE/BANDAGES/DRESSINGS) IMPLANT
SUT PROLENE 4 0 PS 2 18 (SUTURE) ×1 IMPLANT
SUT VIC AB 2-0 SH 27XBRD (SUTURE) IMPLANT
SUT VIC AB 3-0 SH 27X BRD (SUTURE) IMPLANT
SYSTEM IMPLANT TIGHTROPE MINI (Anchor) IMPLANT
TRAP FLUID SMOKE EVACUATOR (MISCELLANEOUS) ×1 IMPLANT
WIRE Z .062 C-WIRE SPADE TIP (WIRE) IMPLANT

## 2024-02-27 NOTE — Transfer of Care (Signed)
 Immediate Anesthesia Transfer of Care Note  Patient: Julie Vang  Procedure(s) Performed: CARPOMETACARPAL (CMC) FUSION OF THUMB (Right: Thumb)  Patient Location: PACU  Anesthesia Type:General  Level of Consciousness: awake, alert , drowsy, and patient cooperative  Airway & Oxygen Therapy: Patient Spontanous Breathing and Patient connected to face mask oxygen  Post-op Assessment: Report given to RN and Post -op Vital signs reviewed and stable  Post vital signs: Reviewed and stable  Last Vitals:  Vitals Value Taken Time  BP 153/90 02/27/24 11:00  Temp    Pulse 75 02/27/24 11:00  Resp 20 02/27/24 11:00  SpO2 99 % 02/27/24 11:00  Vitals shown include unfiled device data.  Last Pain:  Vitals:   02/27/24 0818  TempSrc: Temporal  PainSc: 0-No pain         Complications: No notable events documented.

## 2024-02-27 NOTE — Discharge Instructions (Addendum)
 Orthopedic discharge instructions: Keep splint dry and intact. Keep hand elevated above heart level. Apply ice to affected area frequently. Take ibuprofen 600-800 mg TID with meals for 3-5 days, then as necessary. Take pain medication as prescribed or ES Tylenol when needed.  Return for follow-up in 10-14 days or as scheduled.

## 2024-02-27 NOTE — Progress Notes (Signed)
 Dr. Edie sent script for nausea (Zofran  for home use).

## 2024-02-27 NOTE — Anesthesia Postprocedure Evaluation (Signed)
 Anesthesia Post Note  Patient: Julie Vang  Procedure(s) Performed: CARPOMETACARPAL (CMC) FUSION OF THUMB (Right: Thumb)  Patient location during evaluation: PACU Anesthesia Type: General Level of consciousness: awake and alert, oriented and patient cooperative Pain management: pain level controlled Vital Signs Assessment: post-procedure vital signs reviewed and stable Respiratory status: spontaneous breathing, nonlabored ventilation and respiratory function stable Cardiovascular status: blood pressure returned to baseline and stable Postop Assessment: adequate PO intake Anesthetic complications: no   No notable events documented.   Last Vitals:  Vitals:   02/27/24 1139 02/27/24 1143  BP: (!) 136/90   Pulse: 69 64  Resp: 16 13  Temp: (!) 36.3 C   SpO2: 98% 96%    Last Pain:  Vitals:   02/27/24 1143  TempSrc:   PainSc: 6                  Felix Pratt

## 2024-02-27 NOTE — Op Note (Addendum)
 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  Post-Op Diagnosis:   Same.  Procedure:   Suspension arthroplasty right thumb CMC joint.   Surgeon:   DOROTHA Reyes Maltos, MD  Assistant:   None  Anesthesia:   General LMA  Findings:   As above.  Complications:   None  EBL:   0 cc  Fluids:   600 cc crystalloid  TT:   69 minutes at 250 mmHg  Drains:   None  Closure:   3-0 Vicryl subcuticular sutures  Implants:   Arthrex 1.1 mm CMC Mini Tightrope.  Brief Clinical Note:   The patient is a 53 year old female with a long history of progressively worsening basilar right thumb pain. The patient's symptoms have progressed despite medications, activity modification, injections, splinting, etc. The patient's history and examination are consistent with advanced degenerative joint disease of the right thumb CMC joint which was confirmed by plain radiographs. The patient presents at this time for a suspension arthroplasty of the right thumb CMC joint.  Procedure:    The patient was brought into the operating room and lain in the supine position. After adequate general laryngeal mask anesthesia was obtained, the patient's right hand and upper extremity were prepped with ChloraPrep solution before being draped sterilely. Preoperative antibiotics were administered. A timeout was performed to verify the appropriate surgical site before the limb was exsanguinated with an Esmarch and the tourniquet inflated to 250 mmHg.   An approximately 4-5 cm longitudinal incision was made centered over the radial aspect of the thumb CMC joint. The incision was carried down through the subcutaneous cutaneous tissues with care taken to identify and protect the local neurovascular structures. Several small veins were cauterized with bipolar electrocautery. A longitudinal incision was made through the capsular tissues over the dorsal aspect of  the thumb CMC joint. Subperiosteal dissection was carried out to expose the trapezium dorsally and volarly. Once the [facility] and scaphotrapezial joints were identified, a sagittal cut was made in the trapezium using the micro-oscillating saw. This cut extended approximately two-thirds down through the bone. A Hoke osteotome was used to complete transection of the bone. The bone was then removed piecemeal using rongeurs and further dissection. The flexor carpi radialis tendon was identified at the base of the defect. The base of the thumb metacarpal was prepared by exposing its radial base.  The Arthrex 1.1 mm guidewire was drilled up through the proximal end of the thumb metacarpal beginning at the dorsoradial surface and advancing into the volar radial aspect of the proximal index metacarpal shaft. The adequacy of pin position was verified using FluoroScan imaging in AP, lateral, and oblique projections and found to be excellent.  An approximate 1.5 cm incision was made over the dorsal aspect of the proximal end of the index metacarpal. The incision was carried down through the subcutaneous tissues with care taken to avoid damaging the extensor tendon. The interosseous muscle was dissected away from the dorso-ulnar aspect of the proximal index metacarpal shaft before the guidewire was advanced through the final cortex into this wound. The suture was passed through the nitinol wire loop and pulled across the thumb and index metacarpals, seating the mini-Endobutton against the proximal end of the thumb metacarpal. The suture was cut and each and passed through the second mini-Endobutton. The Endobutton was advanced to rest against the dorso-ulnar aspect of the proximal index metacarpal shaft before it  was tied securely with appropriate tension to stabilize the base of the thumb metacarpal against the base of the index metacarpal. Again, the adequacy of Endobutton position and metacarpal reduction was verified  fluoroscopically in AP, lateral, and oblique projections and found to be excellent. The thumb was then placed through a range of motion. It was able to be abducted and adducted fully and remained distracted to gentle axial loading.  The wounds were copiously irrigated with sterile saline solution using bulb irrigation. The thumb CMC capsular tissues were reapproximated using 2-0 Vicryl interrupted sutures before the skin was closed using 3-0 Vicryl subcuticular interrupted sutures. Benzoin and Steri-Strips were applied to the skin. The more dorsal incision also was closed using 3-0 Vicryl subcuticular sutures before benzoin and Steri-Strips were applied to the skin. A total of 10 cc of 0.5% plain Sensorcaine  was injected in and around both incisions to help with postoperative analgesia. A sterile bulky dressing was applied to the wounds before the patient was placed into a fiberglass thumb spica splint maintaining the thumb in the position of function. The patient was then awakened, extubated, and returned to the recovery room in satisfactory condition after tolerating the procedure well.

## 2024-02-27 NOTE — Anesthesia Preprocedure Evaluation (Addendum)
 Anesthesia Evaluation  Patient identified by MRN, date of birth, ID band Patient awake    Reviewed: Allergy & Precautions, NPO status , Patient's Chart, lab work & pertinent test results  History of Anesthesia Complications (+) PONV and history of anesthetic complications  Airway Mallampati: I   Neck ROM: Full    Dental  (+) Missing   Pulmonary Current Smoker (1/2 ppd)Patient did not abstain from smoking.   Pulmonary exam normal breath sounds clear to auscultation       Cardiovascular negative cardio ROS Normal cardiovascular exam Rhythm:Regular Rate:Normal     Neuro/Psych  PSYCHIATRIC DISORDERS  Depression    negative neurological ROS     GI/Hepatic ,GERD  ,,  Endo/Other  negative endocrine ROS    Renal/GU negative Renal ROS     Musculoskeletal   Abdominal   Peds  Hematology negative hematology ROS (+)   Anesthesia Other Findings   Reproductive/Obstetrics                              Anesthesia Physical Anesthesia Plan  ASA: 2  Anesthesia Plan: General   Post-op Pain Management:    Induction: Intravenous  PONV Risk Score and Plan: 3 and Ondansetron , Dexamethasone  and Treatment may vary due to age or medical condition  Airway Management Planned: LMA  Additional Equipment:   Intra-op Plan:   Post-operative Plan: Extubation in OR  Informed Consent: I have reviewed the patients History and Physical, chart, labs and discussed the procedure including the risks, benefits and alternatives for the proposed anesthesia with the patient or authorized representative who has indicated his/her understanding and acceptance.     Dental advisory given  Plan Discussed with: CRNA  Anesthesia Plan Comments: (Patient consented for risks of anesthesia including but not limited to:  - adverse reactions to medications - damage to eyes, teeth, lips or other oral mucosa - nerve damage due  to positioning  - sore throat or hoarseness - damage to heart, brain, nerves, lungs, other parts of body or loss of life  Informed patient about role of CRNA in peri- and intra-operative care.  Patient voiced understanding.)         Anesthesia Quick Evaluation

## 2024-02-27 NOTE — Anesthesia Procedure Notes (Signed)
 Procedure Name: LMA Insertion Date/Time: 02/27/2024 9:28 AM  Performed by: Vannie Smaller, CRNAPre-anesthesia Checklist: Patient identified, Patient being monitored, Timeout performed, Emergency Drugs available and Suction available Patient Re-evaluated:Patient Re-evaluated prior to induction Oxygen Delivery Method: Circle system utilized Preoxygenation: Pre-oxygenation with 100% oxygen Induction Type: IV induction Ventilation: Mask ventilation without difficulty LMA: LMA inserted LMA Size: 4.0 Tube type: Oral Number of attempts: 1 Placement Confirmation: positive ETCO2 and breath sounds checked- equal and bilateral Tube secured with: Tape Dental Injury: Teeth and Oropharynx as per pre-operative assessment

## 2024-02-27 NOTE — Progress Notes (Signed)
 Patient c/o'd of nausea, medicated:  medication effective. Tolerated gingerale and crackers without event.  Medicated for pain. Of note:  Patient sleeping: needs to be awakened to question pain score.

## 2024-02-28 ENCOUNTER — Encounter: Payer: Self-pay | Admitting: Surgery
